# Patient Record
Sex: Male | Born: 2000 | Race: Asian | Hispanic: No | Marital: Single
Health system: Southern US, Community
[De-identification: ages and names within clinical notes are randomized; demographics above are authoritative.]

---
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
## 2020-04-01 ENCOUNTER — Ambulatory Visit: Payer: Self-pay | Attending: Internal Medicine

## 2020-04-01 DIAGNOSIS — Z23 Encounter for immunization: Secondary | ICD-10-CM

## 2020-04-01 NOTE — Progress Notes (Signed)
   [facility]  Name:  Lee Moon    MRN: [id number] DOB: [birth year]  04/01/2020  [name] was observed post Covid-19 immunization for 15 minutes without incident. He was provided with Vaccine Information Sheet and instruction to access the V-Safe system.   [name] was instructed to call 911 with any severe reactions post vaccine: Marland Kitchen Difficulty breathing  . Swelling of face and throat  . A fast heartbeat  . A bad rash all over body  . Dizziness and weakness   Immunizations Administered    Name Date Dose VIS Date Route   Pfizer COVID-19 Vaccine 04/01/2020 12:34 PM 0.3 mL 02/16/2020 Intramuscular   Manufacturer: [employer], [employer]   Lot: [id number]   NDC: [address]-0

## 2021-11-09 ENCOUNTER — Ambulatory Visit: Admission: RE | Admit: 2021-11-09 | Discharge: 2021-11-09 | Payer: Self-pay | Source: Ambulatory Visit
